# Patient Record
Sex: Male | Born: 1970 | Race: White | Hispanic: No | Marital: Married | Smoking: Never smoker
Health system: Southern US, Community
[De-identification: ages and names within clinical notes are randomized; demographics above are authoritative.]

## PROBLEM LIST (undated history)

## (undated) DIAGNOSIS — R9431 Abnormal electrocardiogram [ECG] [EKG]: Secondary | ICD-10-CM

## (undated) DIAGNOSIS — Z9889 Other specified postprocedural states: Secondary | ICD-10-CM

## (undated) DIAGNOSIS — I517 Cardiomegaly: Secondary | ICD-10-CM

## (undated) DIAGNOSIS — E785 Hyperlipidemia, unspecified: Secondary | ICD-10-CM

## (undated) DIAGNOSIS — E7800 Pure hypercholesterolemia, unspecified: Secondary | ICD-10-CM

## (undated) DIAGNOSIS — I422 Other hypertrophic cardiomyopathy: Secondary | ICD-10-CM

## (undated) HISTORY — DX: Abnormal electrocardiogram (ECG) (EKG): R94.31

## (undated) HISTORY — DX: Hyperlipidemia, unspecified: E78.5

## (undated) HISTORY — DX: Pure hypercholesterolemia, unspecified: E78.00

## (undated) HISTORY — DX: Cardiomegaly: I51.7

## (undated) HISTORY — DX: Other specified postprocedural states: Z98.890

## (undated) HISTORY — DX: Other hypertrophic cardiomyopathy: I42.2

---

## 2007-02-06 ENCOUNTER — Emergency Department (HOSPITAL_COMMUNITY): Admission: EM | Admit: 2007-02-06 | Discharge: 2007-02-06 | Payer: Self-pay | Admitting: Emergency Medicine

## 2008-09-04 ENCOUNTER — Emergency Department (HOSPITAL_COMMUNITY): Admission: EM | Admit: 2008-09-04 | Discharge: 2008-09-04 | Payer: Self-pay | Admitting: Emergency Medicine

## 2009-12-28 DIAGNOSIS — Z9889 Other specified postprocedural states: Secondary | ICD-10-CM

## 2009-12-28 HISTORY — DX: Other specified postprocedural states: Z98.890

## 2010-01-03 ENCOUNTER — Ambulatory Visit: Payer: Self-pay | Admitting: Cardiology

## 2010-01-08 ENCOUNTER — Ambulatory Visit: Payer: Self-pay

## 2010-01-08 ENCOUNTER — Observation Stay (HOSPITAL_COMMUNITY): Admission: EM | Admit: 2010-01-08 | Discharge: 2010-01-08 | Payer: Self-pay | Admitting: Emergency Medicine

## 2010-01-08 ENCOUNTER — Encounter: Payer: Self-pay | Admitting: Cardiovascular Disease

## 2010-01-08 ENCOUNTER — Encounter (HOSPITAL_COMMUNITY): Admission: RE | Admit: 2010-01-08 | Discharge: 2010-02-28 | Payer: Self-pay | Admitting: Cardiology

## 2010-01-08 ENCOUNTER — Ambulatory Visit: Payer: Self-pay | Admitting: Cardiovascular Disease

## 2010-01-08 HISTORY — PX: CARDIOVASCULAR STRESS TEST: SHX262

## 2010-01-08 HISTORY — PX: CARDIAC CATHETERIZATION: SHX172

## 2010-01-10 ENCOUNTER — Ambulatory Visit: Payer: Self-pay | Admitting: Cardiology

## 2010-01-10 ENCOUNTER — Ambulatory Visit (HOSPITAL_COMMUNITY): Admission: RE | Admit: 2010-01-10 | Discharge: 2010-01-10 | Payer: Self-pay | Admitting: Cardiology

## 2010-01-10 HISTORY — PX: US ECHOCARDIOGRAPHY: HXRAD669

## 2010-01-15 ENCOUNTER — Ambulatory Visit: Payer: Self-pay | Admitting: Cardiology

## 2010-02-12 ENCOUNTER — Encounter: Payer: Self-pay | Admitting: Cardiology

## 2010-05-29 NOTE — Assessment & Plan Note (Signed)
Summary: Cardiology Nuclear Testing  Nuclear Med Background Indications for Stress Test: Evaluation for Ischemia, Abnormal EKG   History: Echo  History Comments: 01/03/10 Echo (quick look): NL LVF, Mod. LVH  Symptoms: Chest Pain, Chest Tightness, DOE, Fatigue    Nuclear Pre-Procedure Cardiac Risk Factors: Family History - CAD, Lipids Caffeine/Decaff Intake: None NPO After: 7:00 PM IV 0.9% NS with Angio Cath: 22g     IV Site: R Antecubital IV Started by: Irean Hong, RN Chest Size (in) 42     Height (in): 68 Weight (lb): 193 BMI: 29.45  Nuclear Med Study 1 or 2 day study:  1 day     Stress Test Type:  Stress Reading MD:  Charlton Haws, MD     Referring MD:  Roger Shelter Resting Radionuclide:  Technetium 19m Tetrofosmin     Resting Radionuclide Dose:  10.9 mCi    Stress Protocol          Nuclear Technologist:  Doyne Keel, CNMT  Rest Procedure  Myocardial perfusion imaging was performed at rest 45 minutes following the intravenous administration of Technetium 4m Tetrofosmin.  Stress Procedure  Stress procedure was cancelled by Dr Demetrius Charity. Ross due to chest pain and abnormal EKG. Dr. Deborah Chalk was consulted and patient was transferred to [facility] for cath. this PM.  QPS Raw Data Images:  Normal; no motion artifact; normal heart/lung ratio. Rest Images:  Inferior Deft  Lung/Heart Ratio:  0.31  (Normal <0.45)  Quantitative Gated Spect Images QGS cine images:  not gated  Findings Moderate risk nuclear study  Evidence for inferior infarct     Overall Impression  Exercise Capacity: Rest ONLY ECG Impression: Inferior J point elevation Overall Impression Comments: Rest only study significant inferior wall defect.  Cath and inpatient hospitalizatin arranged for patient

## 2010-07-12 LAB — DIFFERENTIAL
Basophils Relative: 0 % (ref 0–1)
Lymphocytes Relative: 14 % (ref 12–46)
Lymphs Abs: 1.5 10*3/uL (ref 0.7–4.0)
Neutro Abs: 8.5 10*3/uL — ABNORMAL HIGH (ref 1.7–7.7)

## 2010-07-12 LAB — COMPREHENSIVE METABOLIC PANEL
ALT: 32 U/L (ref 0–53)
BUN: 9 mg/dL (ref 6–23)
GFR calc Af Amer: >60 mL/min (ref >60)
GFR calc non Af Amer: >60 mL/min (ref >60)
Glucose, Bld: 91 mg/dL (ref 70–99)
Sodium: 139 mEq/L (ref 135–145)
Total Protein: 7.6 g/dL (ref 6.0–8.3)

## 2010-07-12 LAB — CBC
Hemoglobin: 15.1 g/dL (ref 13.0–17.0)
MCH: 29.8 pg (ref 26.0–34.0)
RBC: 5.06 MIL/uL (ref 4.22–5.81)
RDW: 13 % (ref 11.5–15.5)

## 2010-07-12 LAB — PROTIME-INR: Prothrombin Time: 13.1 seconds (ref 11.6–15.2)

## 2011-04-09 ENCOUNTER — Ambulatory Visit (INDEPENDENT_AMBULATORY_CARE_PROVIDER_SITE_OTHER): Payer: 59 | Admitting: Nurse Practitioner

## 2011-04-09 ENCOUNTER — Telehealth: Payer: Self-pay | Admitting: Cardiology

## 2011-04-09 ENCOUNTER — Encounter: Payer: Self-pay | Admitting: Nurse Practitioner

## 2011-04-09 VITALS — BP 124/92 | HR 76 | Ht 69.0 in | Wt 199.6 lb

## 2011-04-09 DIAGNOSIS — I422 Other hypertrophic cardiomyopathy: Secondary | ICD-10-CM

## 2011-04-09 MED ORDER — METOPROLOL SUCCINATE ER 25 MG PO TB24: 25.0000 mg | ORAL_TABLET | Freq: Every day | ORAL | Status: DC

## 2011-04-09 NOTE — Patient Instructions (Signed)
We are going to start you on Toprol 25 mg daily.  We will see you back in 2 weeks.  Continue to avoid heavy exertion.  We are going to update your ultrasound of your heart.  Call the [facility] office at [phone] if you have any questions, problems or concerns.

## 2011-04-09 NOTE — Progress Notes (Signed)
   Justin Campbell Date of Birth: [birth year] Medical Record #[id number]  History of Present Illness: Justin Campbell is seen today for a work in visit. He is seen for Dr. Antoine Poche. He is a former patient of Dr. Ronnald Nian. He presented with chest tightness about 14 months ago. EKG was abnormal and felt to be related to LVH. He had a stress test and during that had more EKG changes. He was sent to the hospital and had a cardiac cath per Dr. [place]. He has trivial CAD and is felt to have a hypertrophic CM.  He has not been seen since September of 2011. He has had some chest tightness off and on over the last 14 months. He has avoided strenuous activities and tries to stay hydrated. He is on no medicines. He has had recent labs which are reviewed.   Today, he called with recurrent chest tightness. Seemed to be a bit more intense. No other associated symptoms except for some "flutters". He is not dizzy or lightheaded.   No current outpatient prescriptions on file prior to visit.    No Known Allergies  Past Medical History  Diagnosis Date  . Hyperlipidemia   . LVH (left ventricular hypertrophy)   . Hypertrophic cardiomyopathy     Echo with LV septal thickness of 17mm with apical hypertrophy.  . Abnormal EKG   . S/P cardiac cath September 2011    Trivial CAD with a hypertrophic left ventricle    Past Surgical History  Procedure Date  . Cardiac catheterization 01/08/2010    EF 70%  . US echocardiography 01/10/2010    EF 60-65%  . Cardiovascular stress test 01/08/2010    EVIDENCE FOR INFERIOR INFARCT    History  Smoking status  . Never Smoker   Smokeless tobacco  . Not on file    History  Alcohol Use No    Family History  Problem Relation Age of Onset  . Heart disease Mother   . Hypertension Mother   . Prostate cancer Father   . Heart attack Maternal Grandfather     Review of Systems: The review of systems is per the HPI.  All other systems were reviewed and are negative.  Physical  Exam: BP 124/92  Pulse 76  Ht 5\' 9"  (1.753 m)  Wt 199 lb 9.6 oz (90.538 kg)  BMI 29.48 kg/m2 Patient is very pleasant and in no acute distress. Skin is warm and dry. Color is normal.  HEENT is unremarkable. Normocephalic/atraumatic. PERRL. Sclera are nonicteric. Neck is supple. No masses. No JVD. Lungs are clear. Cardiac exam shows a regular rate and rhythm. Abdomen is soft. Extremities are without edema. Gait and ROM are intact. No gross neurologic deficits noted.   LABORATORY DATA: EKG shows sinus rhythm with changes felt to be consistent with LVH. This tracing is reviewed with Dr. Patty Sermons. It is felt to be unchanged from his past tracing last year.   Assessment / Plan:

## 2011-04-09 NOTE — Telephone Encounter (Addendum)
New message:  Pt called complaining of strange heartrate.  Call sent to triage. Call him back on cell [phone]

## 2011-04-09 NOTE — Telephone Encounter (Signed)
SPOKE WITH PT. PT  CALLED   COMPLAINING OF CHEST DISCOMFORT  AND STRANGE FEELING  WITH HEART  THIS AM  NO OTHER S/S  WOULD LIKE TO  COME IN TODAY FOR EKG FOR REASSURANCE  PT AWARE WILL DISCUSS WITH LORI GERHARDT NP  PER LORI   PT MAY COME  IN TODAY FOR APPT , APPT MADE FOR 12-11-2 AT 2:00 PM PT AWARE . ALSO  PT HAS NOT TAKEN ANY OTC MEDS WITH DECONGESTANT   RECENTLY FOR COLD .Zack Seal

## 2011-04-09 NOTE — Assessment & Plan Note (Signed)
I have discussed his case and reviewed his EKG with Dr. Patty Sermons. We are both in agreement that Justin Campbell could benefit from betablocker therapy. I have started Toprol 25 mg daily. We will see him back in 2 weeks. I will have him see Dr. Antoine Poche as previously planned for his cardiology follow up. He is to continue to avoid strenuous activities. Patient is agreeable to this plan and will call if any problems develop in the interim.

## 2011-04-15 ENCOUNTER — Ambulatory Visit (HOSPITAL_COMMUNITY): Payer: 59 | Attending: Cardiology | Admitting: Radiology

## 2011-04-15 DIAGNOSIS — E785 Hyperlipidemia, unspecified: Secondary | ICD-10-CM | POA: Insufficient documentation

## 2011-04-15 DIAGNOSIS — I421 Obstructive hypertrophic cardiomyopathy: Secondary | ICD-10-CM | POA: Insufficient documentation

## 2011-04-15 DIAGNOSIS — I422 Other hypertrophic cardiomyopathy: Secondary | ICD-10-CM

## 2011-04-19 ENCOUNTER — Telehealth: Payer: Self-pay | Admitting: Cardiology

## 2011-04-19 NOTE — Telephone Encounter (Signed)
Pt will be called with results as soon as they have been reviewed by MD.

## 2011-04-19 NOTE — Telephone Encounter (Addendum)
New Problem:    Patient called wondering when someone was going to call him with the results of his ECHO that he had done on 04/15/11.   Please call [phone]

## 2011-05-06 NOTE — Telephone Encounter (Signed)
Will forward to Dr Antoine Poche to make sure he has seen the results of this testing

## 2011-05-14 ENCOUNTER — Telehealth: Payer: Self-pay | Admitting: *Deleted

## 2011-05-14 NOTE — Telephone Encounter (Signed)
Left message at home number and cell number regarding echo results and keeping appointment this week.  Per Lori/Dr Hochrein nothing unchanged/unexpected on echo results.

## 2011-05-15 NOTE — Telephone Encounter (Signed)
Advised echo appears unchanged and Dr Antoine Poche can discuss further at office visit tomorrow.

## 2011-05-15 NOTE — Telephone Encounter (Signed)
F/U   Patient returning nurse MP call, he can be reached on mobile today

## 2011-05-16 ENCOUNTER — Encounter: Payer: Self-pay | Admitting: Cardiology

## 2011-05-16 ENCOUNTER — Ambulatory Visit (INDEPENDENT_AMBULATORY_CARE_PROVIDER_SITE_OTHER): Payer: 59 | Admitting: Cardiology

## 2011-05-16 DIAGNOSIS — R0600 Dyspnea, unspecified: Secondary | ICD-10-CM | POA: Insufficient documentation

## 2011-05-16 DIAGNOSIS — I422 Other hypertrophic cardiomyopathy: Secondary | ICD-10-CM

## 2011-05-16 DIAGNOSIS — R079 Chest pain, unspecified: Secondary | ICD-10-CM

## 2011-05-16 NOTE — Assessment & Plan Note (Signed)
This is mild and probably unrelated to his cardio myopathy. I have reviewed all available records. I will plan a treadmill test. This will continue to allow me to screen him for high risk features with his hypertrophic disease. Most importantly it will allow me to give him a prescription for exercise as he greatly wants to pursue this.

## 2011-05-16 NOTE — Patient Instructions (Signed)
Your physician has requested that you have an exercise tolerance test. For further information please visit [email or web address]. Please also follow instruction sheet, as given.  The current medical regimen is effective;  continue present plan and medications.  

## 2011-05-16 NOTE — Assessment & Plan Note (Signed)
I would like to review the records from [facility]. It sounds like he's had a very thorough workup looking for all of the high risk features for sudden cardiac death and that he has none of these. We will pursue this workup in the future at intervals as well.

## 2011-05-16 NOTE — Progress Notes (Signed)
   HPI  The patient presents as a new patient for me having previously been treated by Dr.Tennant.  He has a history of hypertrophic cardiomyopathy.  This appears to be mostly concentric.  Following this diagnosis with a markedly abnormal EKG and a cardiac catheterization that demonstrated trivial coronary disease he got a second opinion at [facility]. Sounds like they did cardiopulmonary stress testing which she says is very normal for his age. He had a 48 hour Holter and apparently there was no ectopy. He has not been found to have any high-risk markers for sudden cardiac death. He presents for followup wanting to get more active. He was seen last month with some palpitations. He was given a low dose of beta blocker but he didn't want to keep taking these. She felt slightly fatigued with some. He's not currently bothered by palpitations and has had no presyncope or syncope. He's not having any chest pressure, neck or arm discomfort. He'll only get mildly short of breath walking up a flight of stairs and this is not new.  No Known Allergies  No current outpatient prescriptions on file.    Past Medical History  Diagnosis Date  . Hyperlipidemia   . LVH (left ventricular hypertrophy)   . Hypertrophic cardiomyopathy     Echo with LV septal thickness of 17mm with apical hypertrophy.  . Abnormal EKG   . S/P cardiac cath September 2011    Trivial CAD with a hypertrophic left ventricle    Past Surgical History  Procedure Date  . Cardiac catheterization 01/08/2010    EF 70%  . US echocardiography 01/10/2010    EF 60-65%  . Cardiovascular stress test 01/08/2010    EVIDENCE FOR INFERIOR INFARCT    ROS:  As stated in the HPI and negative for all other systems.  PHYSICAL EXAM BP 125/80  Pulse 70  Ht 5\' 8"  (1.727 m)  Wt 199 lb (90.266 kg)  BMI 30.26 kg/m2 GENERAL:  Well appearing HEENT:  Pupils equal round and reactive, fundi not visualized, oral mucosa unremarkable NECK:  No jugular venous  distention, waveform within normal limits, carotid upstroke brisk and symmetric, no bruits, no thyromegaly LYMPHATICS:  No cervical, inguinal adenopathy LUNGS:  Clear to auscultation bilaterally BACK:  No CVA tenderness CHEST:  Unremarkable HEART:  PMI not displaced or sustained,S1 and S2 within normal limits, no S3, no S4, no clicks, no rubs, no murmurs ABD:  Flat, positive bowel sounds normal in frequency in pitch, no bruits, no rebound, no guarding, no midline pulsatile mass, no hepatomegaly, no splenomegaly EXT:  2 plus pulses throughout, no edema, no cyanosis no clubbing SKIN:  No rashes no nodules NEURO:  Cranial nerves II through XII grossly intact, motor grossly intact throughout PSYCH:  Cognitively intact, oriented to person place and time  ASSESSMENT AND PLAN

## 2011-05-27 ENCOUNTER — Telehealth: Payer: Self-pay | Admitting: Cardiology

## 2011-05-27 NOTE — Telephone Encounter (Addendum)
ROI faxed to [facility] @ [phone] 05/27/11/km  Records received from [facility] gave to Lela  05/27/11/KM

## 2011-06-06 ENCOUNTER — Ambulatory Visit (INDEPENDENT_AMBULATORY_CARE_PROVIDER_SITE_OTHER): Payer: 59 | Admitting: Cardiology

## 2011-06-06 DIAGNOSIS — R079 Chest pain, unspecified: Secondary | ICD-10-CM

## 2011-06-06 NOTE — Progress Notes (Signed)
Exercise Treadmill Test  Pre-Exercise Testing Evaluation Rhythm: normal sinus  Rate: 66   PR:  .13 QRS:  .10  QT:  .41 QTc: .43     Test  Exercise Tolerance Test Ordering MD: Angelina Sheriff, MD  Interpreting MD:  Angelina Sheriff, MD  Unique Test No: 1  Treadmill:  1  Indication for ETT: chest pain - rule out ischemia  Contraindication to ETT: No   Stress Modality: exercise - treadmill  Cardiac Imaging Performed: non   Protocol: standard Bruce - maximal  Max BP:  167/89  Max MPHR (bpm):  179 85% MPR (bpm):  152  MPHR obtained (bpm):  171 % MPHR obtained:  96  Reached 85% MPHR (min:sec):  11:00 Total Exercise Time (min-sec):  13:30  Workload in METS:  16.4 Borg Scale: 17  Reason ETT Terminated:  desired heart rate attained    ST Segment Analysis At Rest: normal ST segments - no evidence of significant ST depression With Exercise: no evidence of significant ST depression  Other Information Arrhythmia:  Yes Angina during ETT:  absent (0) Quality of ETT:  diagnostic  ETT Interpretation:  normal - no evidence of ischemia by ST analysis  Comments: The patient had an excellent exercise tolerance.  There was no chest pain.  There was an appropriate level of dyspnea.  There was a normal heart rate response and normal BP response.  There were rare PVCs at peak exercise. There were no ischemic ST T wave changes and a normal heart rate recovery.  Recommendations: Negative adequate ETT.  No further testing is indicated.  Based on the above I gave the patient a prescription for exercise.  There are no high risk findings with this study concerning his HCM

## 2011-09-27 ENCOUNTER — Encounter: Payer: Self-pay | Admitting: Cardiology

## 2012-06-04 ENCOUNTER — Ambulatory Visit (INDEPENDENT_AMBULATORY_CARE_PROVIDER_SITE_OTHER): Payer: 59 | Admitting: Cardiology

## 2012-06-04 ENCOUNTER — Encounter: Payer: Self-pay | Admitting: Cardiology

## 2012-06-04 VITALS — BP 135/94 | HR 69 | Ht 68.0 in | Wt 203.0 lb

## 2012-06-04 DIAGNOSIS — R0602 Shortness of breath: Secondary | ICD-10-CM

## 2012-06-04 DIAGNOSIS — R0600 Dyspnea, unspecified: Secondary | ICD-10-CM

## 2012-06-04 NOTE — Patient Instructions (Addendum)
The current medical regimen is effective;  continue present plan and medications.  Follow up in 1 year with Dr Hochrein.  You will receive a letter in the mail 2 months before you are due.  Please call us when you receive this letter to schedule your follow up appointment.  

## 2012-06-04 NOTE — Progress Notes (Signed)
   HPI  The patient presents for follow up of hypertrophic cardiomyopathy.  This appears to be mostly concentric.  Work up has included cardiac catheterization that demonstrated trivial coronary disease and a second opinion at [facility].  He had a 48 hour Holter. He has not been found to have any high-risk markers for sudden cardiac death.  I repeated an exercise treadmill test which had been previously. There were no high-risk findings.  Since I last saw the patient he has had his symptoms. He walks daily for exercise. He denies any palpitations, presyncope or syncope. He has had no chest pressure, neck or arm discomfort. He has had no edema. I did review his lipids and his blood pressure as below. He has had no new shortness of breath though he has some chronic dyspnea on daily such as climbing up an incline. However, he has no PND or orthopnea.  Allergies  Allergen Reactions  . Tamiflu (Oseltamivir Phosphate)     No current outpatient prescriptions on file.    Past Medical History  Diagnosis Date  . Hyperlipidemia   . LVH (left ventricular hypertrophy)   . Hypertrophic cardiomyopathy     Echo with LV septal thickness of 17mm with apical hypertrophy.  . Abnormal EKG   . S/P cardiac cath September 2011    Trivial CAD with a hypertrophic left ventricle  . Hypercholesteremia     Past Surgical History  Procedure Date  . Cardiac catheterization 01/08/2010    EF 70%  . US echocardiography 01/10/2010    EF 60-65%  . Cardiovascular stress test 01/08/2010    EVIDENCE FOR INFERIOR INFARCT    ROS:  As stated in the HPI and negative for all other systems.  PHYSICAL EXAM BP 135/94  Pulse 69  Ht 5\' 8"  (1.727 m)  Wt 203 lb (92.08 kg)  BMI 30.87 kg/m2 GENERAL:  Well appearing HEENT:  Pupils equal round and reactive, fundi not visualized, oral mucosa unremarkable NECK:  No jugular venous distention, waveform within normal limits, carotid upstroke brisk and symmetric, no bruits, no  thyromegaly LUNGS:  Clear to auscultation bilaterally CHEST:  Unremarkable HEART:  PMI not displaced or sustained,S1 and S2 within normal limits, no S3, no S4, no clicks, no rubs, no murmurs ABD:  Flat, positive bowel sounds normal in frequency in pitch, no bruits, no rebound, no guarding, no midline pulsatile mass, no hepatomegaly, no splenomegaly EXT:  2 plus pulses throughout, no edema, no cyanosis no clubbing  EKG:   sinus rhythm, rate 69, axis within normal limits, intervals within normal limits, early repolarization pattern, T-wave inversions in I, aVL V2 through V6. No change from previous    ASSESSMENT AND PLAN  Dyslipidemia-  I reviewed recent lipids. His LDL is 140. However, there are no data to suggest statin treatment and primary prevention in this situation. We did review diet.  Hypertrophic cardiomyopathy -  I have reviewed again all available disc records. I reviewed the echo from January 2012 and his stress test.  He has no new symptoms and his exam is unchanged. Therefore, no further imaging or testing is indicated at this time. I will follow him again in one year.  Hypertension - His diastolic has been creeping up recently. However, his weight is up about 5 pounds and notes. We discussed therapeutic lifestyle changes to include weight loss. However, if his diastolic remains elevated he may need further management.

## 2012-09-07 ENCOUNTER — Encounter: Payer: Self-pay | Admitting: Cardiology

## 2013-08-12 ENCOUNTER — Encounter: Payer: Self-pay | Admitting: Cardiology

## 2013-08-12 ENCOUNTER — Ambulatory Visit (INDEPENDENT_AMBULATORY_CARE_PROVIDER_SITE_OTHER): Payer: 59 | Admitting: Cardiology

## 2013-08-12 VITALS — BP 110/80 | HR 69 | Ht 69.0 in | Wt 210.0 lb

## 2013-08-12 DIAGNOSIS — I422 Other hypertrophic cardiomyopathy: Secondary | ICD-10-CM

## 2013-08-12 NOTE — Patient Instructions (Addendum)
Your physician has requested that you have an echocardiogram. Echocardiography is a painless test that uses sound waves to create images of your heart. It provides your doctor with information about the size and shape of your heart and how well your heart's chambers and valves are working. This procedure takes approximately one hour. There are no restrictions for this procedure.  Your physician has requested that you have an exercise tolerance test. For further information please visit [email or web address]. Please also follow instruction sheet, as given.  Your physician wants you to follow-up in: 1 year You will receive a reminder letter in the mail two months in advance. If you don't receive a letter, please call our office to schedule the follow-up appointment.   Return soon for CT cardiac scoring test  Try to get the Alivecor app as discussed with Dr Antoine PocheHochrein  Your physician recommends that you continue on your current medications as directed. Please refer to the Current Medication list given to you today.

## 2013-08-12 NOTE — Progress Notes (Signed)
HPI  The patient presents for follow up of hypertrophic cardiomyopathy.  This appears to be mostly concentric.  Work up has included cardiac catheterization that demonstrated trivial coronary disease and a second opinion at [facility].  He had a 48 hour Holter. He has not been found to have any high-risk markers for sudden cardiac death.  POET (Plain Old Exercise Treadmill) demonstrated no high-risk findings.  Since I last saw the patient he has had his symptoms.  He denies any presyncope or syncope. He has had no chest pressure, neck or arm discomfort. He has had no edema. I did review his lipids and his blood pressure as below. He has had no new shortness of breath and no PND or orthopnea.  He is not exercising as much as I would like.  He does rarely get some very rare palpitations with a feeling of very brief "butterflies" in his chest.   Allergies  Allergen Reactions  . Tamiflu [Oseltamivir Phosphate]     Current Outpatient Prescriptions  Medication Sig Dispense Refill  . triamcinolone (NASACORT) 55 MCG/ACT AERO nasal inhaler Place 2 sprays into the nose daily. As needed       No current facility-administered medications for this visit.    Past Medical History  Diagnosis Date  . Hyperlipidemia   . LVH (left ventricular hypertrophy)   . Hypertrophic cardiomyopathy     Echo with LV septal thickness of 17mm with apical hypertrophy.  . Abnormal EKG   . S/P cardiac cath September 2011    Trivial CAD with a hypertrophic left ventricle  . Hypercholesteremia     Past Surgical History  Procedure Laterality Date  . Cardiac catheterization  01/08/2010    EF 70%  . [place] echocardiography  01/10/2010    EF 60-65%  . Cardiovascular stress test  01/08/2010    EVIDENCE FOR INFERIOR INFARCT    ROS:  As stated in the HPI and negative for all other systems.  PHYSICAL EXAM BP 110/80  Pulse 69  Ht 5\' 9"  (1.753 m)  Wt 210 lb (95.255 kg)  BMI 31.00 kg/m2 GENERAL:  Well appearing HEENT:  Pupils  equal round and reactive, fundi not visualized, oral mucosa unremarkable NECK:  No jugular venous distention, waveform within normal limits, carotid upstroke brisk and symmetric, no bruits, no thyromegaly LUNGS:  Clear to auscultation bilaterally CHEST:  Unremarkable HEART:  PMI not displaced or sustained,S1 and S2 within normal limits, no S3, no S4, no clicks, no rubs, no murmurs ABD:  Flat, positive bowel sounds normal in frequency in pitch, no bruits, no rebound, no guarding, no midline pulsatile mass, no hepatomegaly, no splenomegaly EXT:  2 plus pulses throughout, no edema, no cyanosis no clubbing  EKG:   sinus rhythm, rate 72, axis within normal limits, intervals within normal limits, early repolarization pattern, T-wave inversions in I, aVL V2 through V6. No change from previous .  08/12/2013  ASSESSMENT AND PLAN  Dyslipidemia-  I reviewed recent lipids. His LDL is 149. Given his family history I would like to further screen with a coronary calcium score.   Hypertrophic cardiomyopathy -  I will follow up with risk screening with a treadmill.  (He has to date had no high risk findings.).  I will also bring him back for an echo to follow up.  To evaluate rare palpitations he will get a phone app (AliveCore) and bring to me any symptoms that he might record.    Hypertension - The blood pressure is at  target. No change in medications is indicated. We will continue with therapeutic lifestyle changes (TLC).

## 2013-08-17 ENCOUNTER — Ambulatory Visit (INDEPENDENT_AMBULATORY_CARE_PROVIDER_SITE_OTHER)
Admission: RE | Admit: 2013-08-17 | Discharge: 2013-08-17 | Disposition: A | Discharge status: Home / Self Care | Payer: Self-pay | Source: Ambulatory Visit | Attending: Cardiology | Admitting: Cardiology

## 2013-08-17 DIAGNOSIS — I422 Other hypertrophic cardiomyopathy: Secondary | ICD-10-CM

## 2013-08-18 ENCOUNTER — Telehealth: Payer: Self-pay | Admitting: *Deleted

## 2013-08-18 NOTE — Telephone Encounter (Signed)
Coronary calcium score is zero. Excellent. No change in therapy.  Will continue to attempt to contact pt about results.

## 2013-08-19 NOTE — Telephone Encounter (Signed)
Patient is returning call. Please call and advise.

## 2013-08-19 NOTE — Telephone Encounter (Signed)
Patient aware of results.

## 2013-09-23 ENCOUNTER — Ambulatory Visit (INDEPENDENT_AMBULATORY_CARE_PROVIDER_SITE_OTHER): Payer: 59 | Admitting: Physician Assistant

## 2013-09-23 ENCOUNTER — Ambulatory Visit (HOSPITAL_COMMUNITY): Payer: 59 | Attending: Internal Medicine | Admitting: Radiology

## 2013-09-23 DIAGNOSIS — I422 Other hypertrophic cardiomyopathy: Secondary | ICD-10-CM

## 2013-09-23 NOTE — Progress Notes (Signed)
Exercise Treadmill Test  Pre-Exercise Testing Evaluation Rhythm: normal sinus  Rate: 68 bpm     Test  Exercise Tolerance Test Ordering MD: Angelina Sheriff, MD  Interpreting MD: Tereso Newcomer, PA-C  Unique Test No: 1  Treadmill:  1  Indication for ETT: hypertrophic cardiomyopathy  Contraindication to ETT: No   Stress Modality: exercise - treadmill  Cardiac Imaging Performed: non   Protocol: standard Bruce - maximal  Max BP:  197/91  Max MPHR (bpm):  176 85% MPR (bpm):  150  MPHR obtained (bpm):  162 % MPHR obtained:  92  Reached 85% MPHR (min:sec):  9:48 Total Exercise Time (min-sec):  10:57  Workload in METS:  13.2 Borg Scale: 17  Reason ETT Terminated:  desired heart rate attained    ST Segment Analysis At Rest: Marked ST abnormalities with down-sloping TWI in 1, aVL, ST elevation in 3, aVF - similar to prior tracings (HCM) With Exercise: significant ischemic ST depression  Other Information Arrhythmia:  No Angina during ETT:  absent (0) Quality of ETT:  diagnostic  ETT Interpretation:  abnormal - evidence of ST depression consistent with ischemia  Comments: Excellent exercise capacity. No chest pain. Normal BP response to exercise. No exercise induced ventricular arrhythmias. There was good HR response in recovery. There are baseline ST abnormalities.  There is significant ST depression in the lateral leads at peak exercise.  Recommendations: I will leave the ECGs for review by Dr. Rollene Rotunda. Further recommendations, if any, to follow. Signed,  Tereso Newcomer, PA-C   09/23/2013 11:10 AM

## 2013-09-23 NOTE — Progress Notes (Signed)
Echocardiogram performed.  

## 2013-10-01 NOTE — Progress Notes (Signed)
Dr. James Hochrein reviewed ECGs and felt there was no evidence of ischemia.  Mal Esli Jernigan, PA-C  10/01/2013 2:31 PM   

## 2013-10-04 ENCOUNTER — Telehealth: Payer: Self-pay | Admitting: Cardiology

## 2013-10-04 NOTE — Telephone Encounter (Signed)
Calling wanting to know the results of his exercise treadmill that was done 5/28.  States Justin Campbell told him that he would need for Dr. Antoine Poche to review and he has not heard from anyone.  Advised could not see a note from Dr. Antoine Poche so will forward message to him to review.

## 2013-10-04 NOTE — Telephone Encounter (Signed)
New message    Calling for test results  

## 2013-10-05 NOTE — Telephone Encounter (Signed)
Called and left message of results.  Requested he call back with further questions or concerns.

## 2013-10-05 NOTE — Telephone Encounter (Signed)
Dr. Rollene Rotunda reviewed ECGs and felt there was no evidence of ischemia.  Tereso Newcomer, PA-C  10/01/2013 2:31 PM

## 2014-08-14 IMAGING — CT CT HEART SCORING
1 of 3 series · 10 of 20 positions shown, 13 images · non-contrast
Comparison: No priors.

CLINICAL DATA: Risk stratification

EXAM:
EXAM
Coronary Calcium Score
TECHNIQUE: The patient was scanned on a Siemens Sensation 16 slice scanner.
Axial non-contrast 3mm slices were carried out through the heart.
The data set was analyzed on a dedicated work station and scored
using the Agatson method.

[Series 6: st thins for reformat · axial · 0.71mm/px · z∈[-186,-107]mm · 10 of 97 slices shown, 13 images]
[im 9/97  vessel]
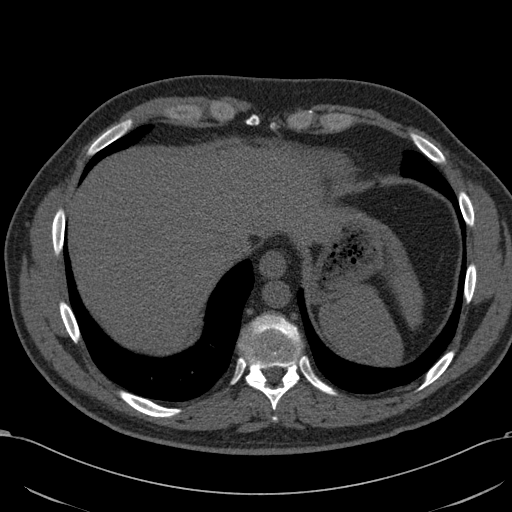
[im 9/97  lung]
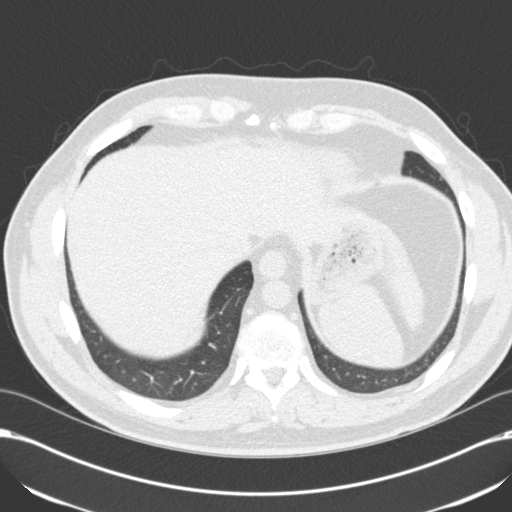
[im 18/97  vessel]
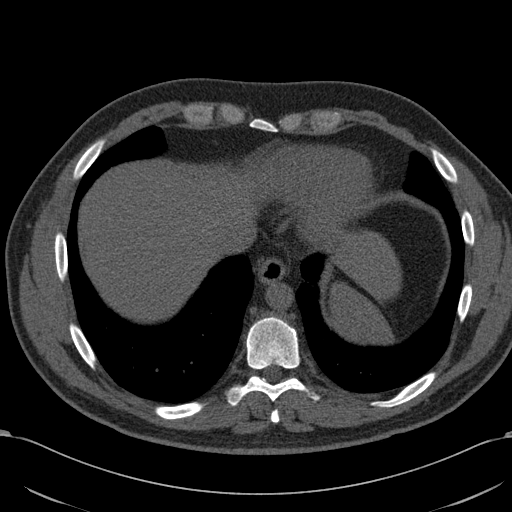
[im 27/97  vessel]
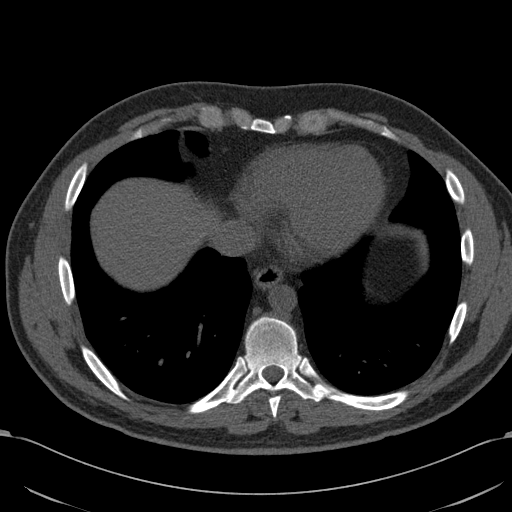
[im 35/97  vessel]
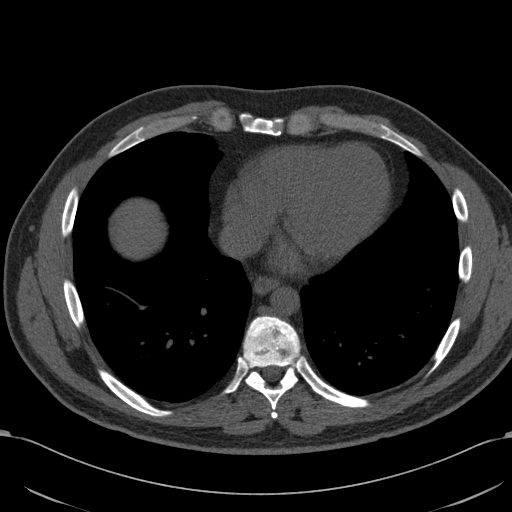
[im 44/97  vessel]
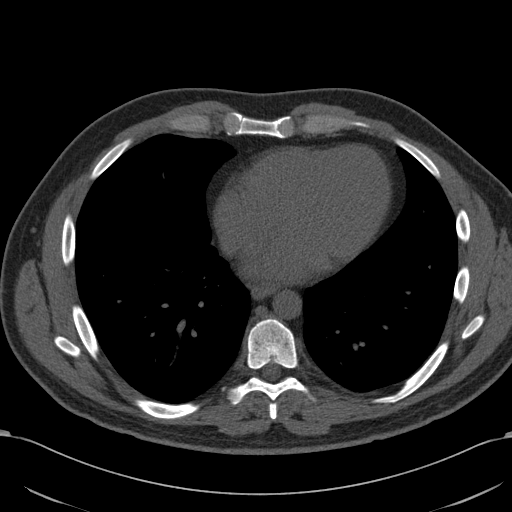
[im 44/97  lung]
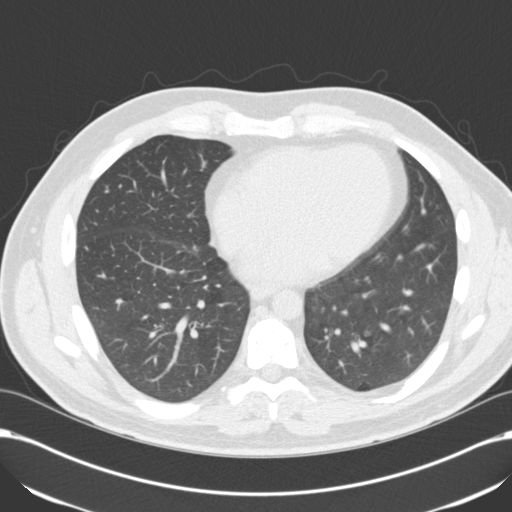
[im 53/97  vessel]
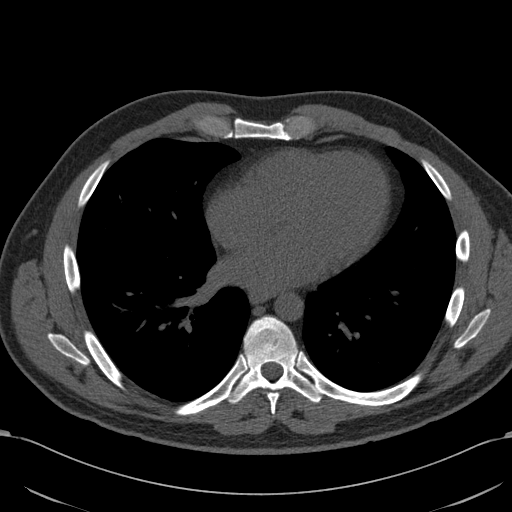
[im 62/97  vessel]
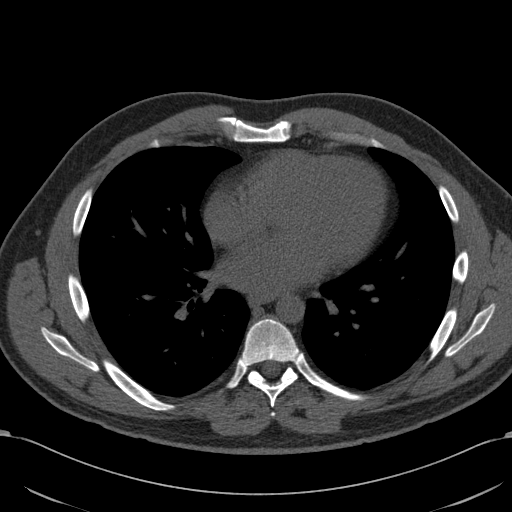
[im 70/97  vessel]
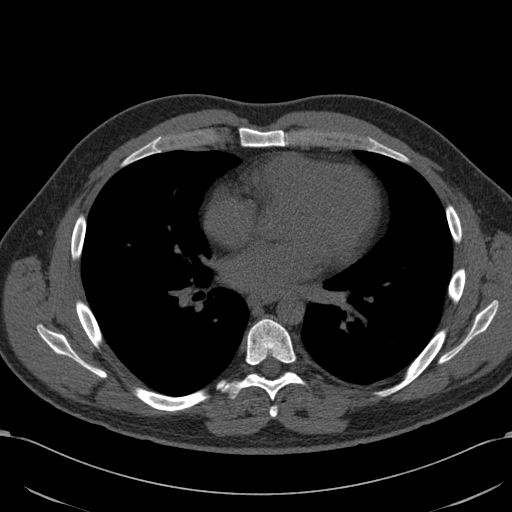
[im 79/97  vessel]
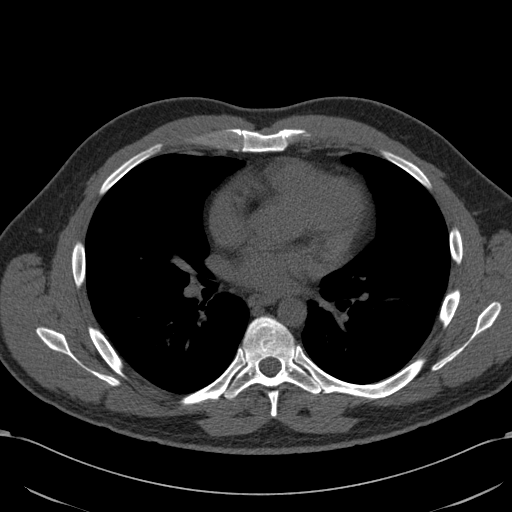
[im 79/97  lung]
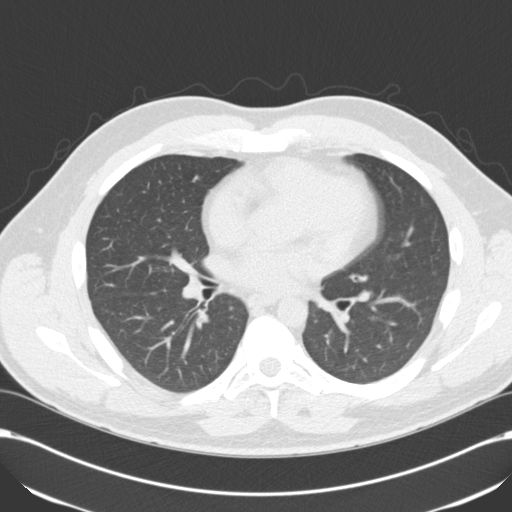
[im 88/97  vessel]
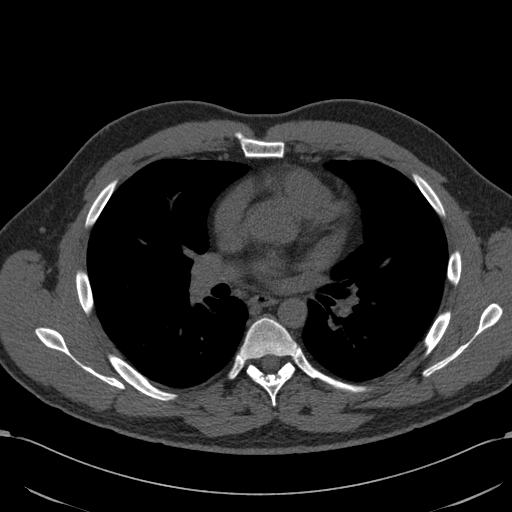

[10 of 20 positions shown; findings below may reference images not displayed]

FINDINGS: Non-cardiac: No significant non cardiac findings on limited lung and
soft tissue windows. See separate report from [REDACTED].

Coronary arteries:  Originating in a normal position.
IMPRESSION: Coronary calcium score of 0. This was 0 percentile for age and sex
matched control.

Yiris Macuil

EXAM:
OVER-READ INTERPRETATION  CT CHEST

The following report is an over-read performed by radiologist Dr.
over-read does not include interpretation of cardiac or coronary
anatomy or pathology. The coronary calcium score interpretation by
the cardiologist is attached.
FINDINGS: Within the visualized portions of the thorax There is no acute
consolidative airspace disease, no pleural effusions and no
suspicious appearing pulmonary nodules or masses. Visualized
portions of the upper abdomen are unremarkable. There are no
aggressive appearing lytic or blastic lesions noted in the
visualized portions of the skeleton.
IMPRESSION: 1. No significant incidental noncardiac findings.
